# Patient Record
Sex: Female | Born: 1945
Health system: Southern US, Community
[De-identification: ages and names within clinical notes are randomized; demographics above are authoritative.]

## PROBLEM LIST (undated history)

## (undated) DIAGNOSIS — N390 Urinary tract infection, site not specified: Secondary | ICD-10-CM

## (undated) DIAGNOSIS — N879 Dysplasia of cervix uteri, unspecified: Secondary | ICD-10-CM

## (undated) HISTORY — PX: CERVIX LESION DESTRUCTION: SHX591

## (undated) HISTORY — DX: Dysplasia of cervix uteri, unspecified: N87.9

## (undated) HISTORY — DX: Urinary tract infection, site not specified: N39.0

---

## 2016-01-14 ENCOUNTER — Encounter: Payer: Self-pay | Admitting: Family Medicine

## 2016-01-14 ENCOUNTER — Ambulatory Visit (INDEPENDENT_AMBULATORY_CARE_PROVIDER_SITE_OTHER): Payer: Medicare Other | Admitting: Family Medicine

## 2016-01-14 VITALS — BP 116/82 | HR 94 | Temp 97.6°F | Ht 64.5 in | Wt 141.0 lb

## 2016-01-14 DIAGNOSIS — F909 Attention-deficit hyperactivity disorder, unspecified type: Secondary | ICD-10-CM

## 2016-01-14 DIAGNOSIS — R635 Abnormal weight gain: Secondary | ICD-10-CM

## 2016-01-14 DIAGNOSIS — S46812A Strain of other muscles, fascia and tendons at shoulder and upper arm level, left arm, initial encounter: Secondary | ICD-10-CM | POA: Diagnosis not present

## 2016-01-14 DIAGNOSIS — S46819A Strain of other muscles, fascia and tendons at shoulder and upper arm level, unspecified arm, initial encounter: Secondary | ICD-10-CM | POA: Insufficient documentation

## 2016-01-14 DIAGNOSIS — F988 Other specified behavioral and emotional disorders with onset usually occurring in childhood and adolescence: Secondary | ICD-10-CM | POA: Insufficient documentation

## 2016-01-14 NOTE — Assessment & Plan Note (Signed)
Reports history of this. Patient will bring in evaluation from psychologist for my review. Once I reviewed this we will discuss medication to treat this issue.

## 2016-01-14 NOTE — Assessment & Plan Note (Signed)
Location of discomfort and exam most consistent with trapezius strain. Neurologically intact. Discussed heat and ice. Tylenol and Motrin. Advised to limit Motrin. Offered muscle relaxer, though she declined. Given stretching exercises for her neck. Given return precautions.

## 2016-01-14 NOTE — Assessment & Plan Note (Signed)
Patient still has normal BMI. Discussed diet and exercise. She will try to make changes to her diet as able.

## 2016-01-14 NOTE — Progress Notes (Signed)
Patient ID: Shelby Boyd, female   DOB: 25-Jul-1946, 70 y.o.   MRN: [id number]  Shelby Alar, MD Phone: [phone]  [name] is a 70 y.o. female who presents today for new patient visit.  Left neck discomfort: Patient notes for the last 3 weeks the left side of her neck has felt mildly stiff and had a mild tingling to it. She notes this is started after they started renovating her house. She has been doing a significant amount of manual labor and holding her neck and on positions over this time period. No specific injury. Notes hurts to move her neck sometimes. No radiation of symptoms down her arms or legs. No numbness, weakness, or tingling. No fevers, loss of bowel or bladder function, or saddle anesthesia. Notes having better posture helps with this. She's been taking Tylenol and Motrin intermittently for this.  Weight gain: Patient notes for the last year and a half weight has been slowly creeping up. She typically weighs 131-132 pounds. States she is battling the same 8 pounds. She notes weight gain occurred around the time she stopped Prempro and her ADD medication. She also used to play a lot of tennis when they were in [place]. She notes her diet is not as good now as it used to be as they're living in an apartment in renovating a house. Eating more prepared foods.  ADD: Patient notes she underwent testing in her 62s who is in law school. She has a psychological testing paperwork at home. She is on Dexedrine in the past. Has noted a significant difference since being off of this over the last 2 years. While on the medicine she is able to focus significantly better.  Active Ambulatory Problems    Diagnosis Date Noted  . Trapezius strain 01/14/2016  . Weight gain 01/14/2016  . ADD (attention deficit disorder) 01/14/2016   Resolved Ambulatory Problems    Diagnosis Date Noted  . No Resolved Ambulatory Problems   Past Medical History  Diagnosis Date  . UTI (lower urinary  tract infection)   . Precancerous changes of the cervix     Family History  Problem Relation Age of Onset  . Arthritis    . Hyperlipidemia    . Heart disease    . Hypertension    . Stroke    . Diabetes    . Bone cancer Mother     Jaw  . Tuberculosis      Grandmother in the 28s    Social History   Social History  . Marital Status: Married    Spouse Name: N/A  . Number of Children: N/A  . Years of Education: N/A   Occupational History  . Not on file.   Social History Main Topics  . Smoking status: Former Games developer  . Smokeless tobacco: Not on file  . Alcohol Use: 0.0 oz/week    0 Standard drinks or equivalent per week     Comment: glass a wine once a month  . Drug Use: No  . Sexual Activity: Not on file   Other Topics Concern  . Not on file   Social History Narrative  . No narrative on file    ROS   General:  Negative for nexplained weight loss, fever Skin: Negative for new or changing mole, sore that won't heal HEENT: Negative for trouble hearing, trouble seeing, ringing in ears, mouth sores, hoarseness, change in voice, dysphagia. CV:  Negative for chest pain, dyspnea, edema, palpitations Resp: Negative for cough, dyspnea,  hemoptysis GI: Negative for nausea, vomiting, diarrhea, constipation, abdominal pain, melena, hematochezia. GU: Negative for dysuria, incontinence, urinary hesitance, hematuria, vaginal or penile discharge, polyuria, sexual difficulty, lumps in testicle or breasts MSK: Positive for neck pain, Negative for muscle cramps or aches, joint pain or swelling Neuro: Negative for headaches, weakness, numbness, dizziness, passing out/fainting Psych: Negative for depression, anxiety, memory problems  Objective  Physical Exam Filed Vitals:   01/14/16 0926  BP: 116/82  Pulse: 94  Temp: 97.6 F (36.4 C)    BP Readings from Last 3 Encounters:  01/14/16 116/82   Wt Readings from Last 3 Encounters:  01/14/16 141 lb (63.957 kg)    Physical  Exam  Constitutional: She is well-developed, well-nourished, and in no distress.  HENT:  Head: Normocephalic and atraumatic.  Right Ear: External ear normal.  Left Ear: External ear normal.  Mouth/Throat: Oropharynx is clear and moist. No oropharyngeal exudate.  Eyes: Conjunctivae are normal. Pupils are equal, round, and reactive to light.  Neck: Neck supple.  Cardiovascular: Normal rate, regular rhythm and normal heart sounds.  Exam reveals no gallop and no friction rub.   No murmur heard. Pulmonary/Chest: Effort normal. No respiratory distress. She has no wheezes. She has no rales.  Abdominal: Soft. Bowel sounds are normal. She exhibits no distension. There is no tenderness. There is no rebound and no guarding.  Musculoskeletal:  No midline spine tenderness, mild muscular left neck tenderness with no spasm or swelling, full range of motion of neck rotation  Lymphadenopathy:    She has no cervical adenopathy.  Neurological: She is alert.  5/5 strength in bilateral biceps, triceps, grip, quads, hamstrings, plantar and dorsiflexion, sensation to light touch intact in bilateral UE and LE, normal gait, 2+ patellar reflexes  Skin: Skin is warm and dry. She is not diaphoretic.  Psychiatric: Mood and affect normal.     Assessment/Plan:   Trapezius strain Location of discomfort and exam most consistent with trapezius strain. Neurologically intact. Discussed heat and ice. Tylenol and Motrin. Advised to limit Motrin. Offered muscle relaxer, though she declined. Given stretching exercises for her neck. Given return precautions.  Weight gain Patient still has normal BMI. Discussed diet and exercise. She will try to make changes to her diet as able.  ADD (attention deficit disorder) Reports history of this. Patient will bring in evaluation from psychologist for my review. Once I reviewed this we will discuss medication to treat this issue.    Shelby Boyd

## 2016-01-14 NOTE — Progress Notes (Signed)
Pre visit review using our clinic review tool, if applicable. No additional management support is needed unless otherwise documented below in the visit note. 

## 2016-01-14 NOTE — Patient Instructions (Signed)
Nice to meet you. You likely strained a muscle in her neck. Please do the following exercises. She can also use Motrin and Tylenol as needed. Try to limit the number of Motrin you take per day. Please continue to monitor your diet by keeping a diet journal. We will continue to work on your weight. If you develop numbness, weakness, tingling, or any new or changing symptoms please seek medical attention.

## 2016-01-21 ENCOUNTER — Telehealth: Payer: Self-pay | Admitting: Family Medicine

## 2016-01-21 NOTE — Telephone Encounter (Signed)
Pt dropped of medical records that Dr. Birdie Sons requested. Records are in Dr. Purvis Sheffield box in a brown envelope.

## 2016-01-22 NOTE — Telephone Encounter (Signed)
Put on Dr. Birdie Sons desk

## 2016-02-11 ENCOUNTER — Ambulatory Visit (INDEPENDENT_AMBULATORY_CARE_PROVIDER_SITE_OTHER): Payer: Medicare Other | Admitting: Family Medicine

## 2016-02-11 ENCOUNTER — Encounter: Payer: Self-pay | Admitting: Family Medicine

## 2016-02-11 VITALS — BP 128/84 | HR 87 | Temp 97.6°F | Ht 64.5 in | Wt 143.0 lb

## 2016-02-11 DIAGNOSIS — S46812D Strain of other muscles, fascia and tendons at shoulder and upper arm level, left arm, subsequent encounter: Secondary | ICD-10-CM

## 2016-02-11 DIAGNOSIS — R635 Abnormal weight gain: Secondary | ICD-10-CM | POA: Diagnosis not present

## 2016-02-11 DIAGNOSIS — F909 Attention-deficit hyperactivity disorder, unspecified type: Secondary | ICD-10-CM | POA: Diagnosis not present

## 2016-02-11 DIAGNOSIS — F988 Other specified behavioral and emotional disorders with onset usually occurring in childhood and adolescence: Secondary | ICD-10-CM

## 2016-02-11 DIAGNOSIS — S46812A Strain of other muscles, fascia and tendons at shoulder and upper arm level, left arm, initial encounter: Secondary | ICD-10-CM

## 2016-02-11 MED ORDER — AMPHETAMINE-DEXTROAMPHET ER 10 MG PO CP24: 10.0000 mg | ORAL_CAPSULE | Freq: Every day | ORAL | Status: DC

## 2016-02-11 NOTE — Assessment & Plan Note (Signed)
Resolved. Will continue stretches as needed. Can continue heat as well. She will continue to monitor. Given return precautions.

## 2016-02-11 NOTE — Patient Instructions (Signed)
Nice to see you. We will start you on Adderall XR 10 mg daily. Please monitor for sleep issues and appetite issues if he has trouble sleeping or have decreased appetite please let us know. Please start exercising as we discussed. Please continue to monitor your neck pain.

## 2016-02-11 NOTE — Assessment & Plan Note (Signed)
Up 2 more pounds. Still with a normal BMI. Discussed diet and exercise again. She will start exercising more once weather improves. We'll continue to monitor.

## 2016-02-11 NOTE — Assessment & Plan Note (Signed)
Remains an issue. Paperwork was reviewed. Appears that she could benefit from stimulant medication to help with this issue. We will start her on Adderall XR 10 mg daily. She was advised to monitor for insomnia and decreased appetite. We'll see her back in a month to see how this is helped.

## 2016-02-11 NOTE — Progress Notes (Signed)
Patient ID: Shelby Boyd, female   DOB: 04/01/1946, 70 y.o.   MRN: [id number]  Shelby AlarEric Castulo Scarpelli, MD Phone: [phone]  Shelby Boyd is a 70 y.o. female who presents today for follow-up.  ADD: Patient previously brought in paperwork outlining psychological evaluation for ADD. This was reviewed. She notes attention is still an issue. Notes planning is not as good when she is not on medication. She notes typically a low dose of Dexedrine in the past as been beneficial.  Weight gain: Patient is up 2 pounds from her last visit. She has not really increased her physical activity. She notes they have been eating somewhat better by eating at home more often. They have joined the tennis club and joined the [employer] and will be exercising more once weather improves.  Trapezius strain: Is much improved. No pain at this time. Notes heat and stretches helped significantly. Range of motion is significantly improved.  PMH: Former smoker.   ROS see history of present illness  Objective  Physical Exam Filed Vitals:   02/11/16 0926  BP: 128/84  Pulse: 87  Temp: 97.6 F (36.4 C)    BP Readings from Last 3 Encounters:  02/11/16 128/84  01/14/16 116/82   Wt Readings from Last 3 Encounters:  02/11/16 143 lb (64.864 kg)  01/14/16 141 lb (63.957 kg)    Physical Exam  Constitutional: She is well-developed, well-nourished, and in no distress.  Cardiovascular: Normal rate, regular rhythm and normal heart sounds.   Pulmonary/Chest: Effort normal and breath sounds normal.  Musculoskeletal:  No midline neck tenderness, no midline neck or step off, no muscular neck tenderness, full range of motion of neck  Neurological: She is alert. Gait normal.  5 out of 5 strength in bilateral biceps, triceps, grip, sensation to light touch intact in bilateral upper extremities  Skin: Skin is warm and dry. She is not diaphoretic.     Assessment/Plan: Please see individual problem list.  Trapezius  strain Resolved. Will continue stretches as needed. Can continue heat as well. She will continue to monitor. Given return precautions.  ADD (attention deficit disorder) Remains an issue. Paperwork was reviewed. Appears that she could benefit from stimulant medication to help with this issue. We will start her on Adderall XR 10 mg daily. She was advised to monitor for insomnia and decreased appetite. We'll see her back in a month to see how this is helped.  Weight gain Up 2 more pounds. Still with a normal BMI. Discussed diet and exercise again. She will start exercising more once weather improves. We'll continue to monitor.    No orders of the defined types were placed in this encounter.    Meds ordered this encounter  Medications  . amphetamine-dextroamphetamine (ADDERALL XR) 10 MG 24 hr capsule    Sig: Take 1 capsule (10 mg total) by mouth daily.    Dispense:  30 capsule    Refill:  0   Shelby AlarEric Adler Alton, MD [facility] [place]

## 2016-03-10 ENCOUNTER — Encounter: Payer: Self-pay | Admitting: Family Medicine

## 2016-03-10 ENCOUNTER — Ambulatory Visit (INDEPENDENT_AMBULATORY_CARE_PROVIDER_SITE_OTHER): Payer: Medicare Other | Admitting: Family Medicine

## 2016-03-10 VITALS — BP 118/76 | HR 92 | Temp 97.8°F | Ht 64.5 in | Wt 137.4 lb

## 2016-03-10 DIAGNOSIS — R635 Abnormal weight gain: Secondary | ICD-10-CM

## 2016-03-10 DIAGNOSIS — F909 Attention-deficit hyperactivity disorder, unspecified type: Secondary | ICD-10-CM

## 2016-03-10 DIAGNOSIS — F988 Other specified behavioral and emotional disorders with onset usually occurring in childhood and adolescence: Secondary | ICD-10-CM

## 2016-03-10 DIAGNOSIS — Z01419 Encounter for gynecological examination (general) (routine) without abnormal findings: Secondary | ICD-10-CM | POA: Diagnosis not present

## 2016-03-10 MED ORDER — AMPHETAMINE-DEXTROAMPHET ER 10 MG PO CP24: 10.0000 mg | ORAL_CAPSULE | Freq: Every day | ORAL | Status: DC

## 2016-03-10 NOTE — Patient Instructions (Signed)
Nice to see you. Congratulations on weight loss. Continue your dietary changes and exercise. I refilled your Adderall. There is been a referral placed to the gynecologist. If you do not hear anything in the next week please let us know.

## 2016-03-10 NOTE — Assessment & Plan Note (Signed)
Well-controlled at this time. Tolerating Adderall dose. We'll continue this. Refill given for a month and then 90 days to send her mail-order pharmacy.

## 2016-03-10 NOTE — Progress Notes (Signed)
Patient ID: Shelby Boyd, female   DOB: 01/18/1946, 70 y.o.   MRN: [id number]  Marikay AlarEric Keeanna Villafranca, MD Phone: [phone]  Shelby Boyd is a 70 y.o. female who presents today for follow-up.  ADD: Patient notes the Adderall is been significantly helpful. She is happier than previously. She is planning better. She does note she had bizarre dreams the first 2 nights being on it. No palpitations. Blood pressure is been okay. Appetite is good. No tremor.  Weight gain: Patient is down 6 pounds since her last visit. Not craving carbs as much since being on the Adderall. She is eating more proteins and vegetables. Eating slower to decrease her portion sizes. She is more active now that it is warmer. Playing tennis more now.  PMH: Former smoker   ROS see history of present illness  Objective  Physical Exam Filed Vitals:   03/10/16 0941  BP: 118/76  Pulse: 92  Temp: 97.8 F (36.6 C)    BP Readings from Last 3 Encounters:  03/10/16 118/76  02/11/16 128/84  01/14/16 116/82   Wt Readings from Last 3 Encounters:  03/10/16 137 lb 6.4 oz (62.324 kg)  02/11/16 143 lb (64.864 kg)  01/14/16 141 lb (63.957 kg)    Physical Exam  Constitutional: She is well-developed, well-nourished, and in no distress.  HENT:  Head: Normocephalic and atraumatic.  Cardiovascular: Normal rate, regular rhythm and normal heart sounds.   Pulmonary/Chest: Effort normal and breath sounds normal.  Musculoskeletal: She exhibits no edema.  Neurological: She is alert. Gait normal.  Skin: Skin is warm and dry. She is not diaphoretic.     Assessment/Plan: Please see individual problem list.  ADD (attention deficit disorder) Well-controlled at this time. Tolerating Adderall dose. We'll continue this. Refill given for a month and then 90 days to send her mail-order pharmacy.  Weight gain Weight is improving. She's been more physically active. Eating lesser sized portions. She will continue to work on  this.    Orders Placed This Encounter  Procedures  . Ambulatory referral to Gynecology    Referral Priority:  Routine    Referral Type:  Consultation    Referral Reason:  Specialty Services Required    Requested Specialty:  Gynecology    Number of Visits Requested:  1  Referral to gynecology placed for yearly exams.was previously supposed be placed. I apologized for this not being placed earlier.Patient opted to hold off on discussion of DEXA scan and mammography until she followed up with her new gynecologist.  Meds ordered this encounter  Medications  . amphetamine-dextroamphetamine (ADDERALL XR) 10 MG 24 hr capsule    Sig: Take 1 capsule (10 mg total) by mouth daily.    Dispense:  30 capsule    Refill:  0  . amphetamine-dextroamphetamine (ADDERALL XR) 10 MG 24 hr capsule    Sig: Take 1 capsule (10 mg total) by mouth daily.    Dispense:  90 capsule    Refill:  0    Marikay AlarEric Jakai Onofre, MD [facility] [place]

## 2016-03-10 NOTE — Progress Notes (Signed)
Pre visit review using our clinic review tool, if applicable. No additional management support is needed unless otherwise documented below in the visit note. 

## 2016-03-10 NOTE — Assessment & Plan Note (Signed)
Weight is improving. She's been more physically active. Eating lesser sized portions. She will continue to work on this.

## 2016-03-19 DIAGNOSIS — Z01419 Encounter for gynecological examination (general) (routine) without abnormal findings: Secondary | ICD-10-CM | POA: Diagnosis not present

## 2016-03-19 DIAGNOSIS — Z1151 Encounter for screening for human papillomavirus (HPV): Secondary | ICD-10-CM | POA: Diagnosis not present

## 2016-03-19 DIAGNOSIS — Z1231 Encounter for screening mammogram for malignant neoplasm of breast: Secondary | ICD-10-CM | POA: Diagnosis not present

## 2016-03-19 LAB — HM MAMMOGRAPHY

## 2016-04-20 ENCOUNTER — Telehealth: Payer: Self-pay

## 2016-04-20 ENCOUNTER — Encounter: Payer: Self-pay | Admitting: Emergency Medicine

## 2016-04-20 ENCOUNTER — Emergency Department: Payer: Medicare Other

## 2016-04-20 ENCOUNTER — Emergency Department
Admission: EM | Admit: 2016-04-20 | Discharge: 2016-04-20 | Disposition: A | Discharge status: Home / Self Care | Payer: Medicare Other | Attending: Emergency Medicine | Admitting: Emergency Medicine

## 2016-04-20 DIAGNOSIS — Y9389 Activity, other specified: Secondary | ICD-10-CM | POA: Insufficient documentation

## 2016-04-20 DIAGNOSIS — Z79899 Other long term (current) drug therapy: Secondary | ICD-10-CM | POA: Diagnosis not present

## 2016-04-20 DIAGNOSIS — Y999 Unspecified external cause status: Secondary | ICD-10-CM | POA: Insufficient documentation

## 2016-04-20 DIAGNOSIS — M799 Soft tissue disorder, unspecified: Secondary | ICD-10-CM | POA: Diagnosis not present

## 2016-04-20 DIAGNOSIS — Z87891 Personal history of nicotine dependence: Secondary | ICD-10-CM | POA: Diagnosis not present

## 2016-04-20 DIAGNOSIS — S50851A Superficial foreign body of right forearm, initial encounter: Secondary | ICD-10-CM | POA: Diagnosis not present

## 2016-04-20 DIAGNOSIS — F909 Attention-deficit hyperactivity disorder, unspecified type: Secondary | ICD-10-CM | POA: Insufficient documentation

## 2016-04-20 DIAGNOSIS — S5011XA Contusion of right forearm, initial encounter: Secondary | ICD-10-CM

## 2016-04-20 DIAGNOSIS — Y92009 Unspecified place in unspecified non-institutional (private) residence as the place of occurrence of the external cause: Secondary | ICD-10-CM | POA: Insufficient documentation

## 2016-04-20 DIAGNOSIS — W458XXA Other foreign body or object entering through skin, initial encounter: Secondary | ICD-10-CM | POA: Diagnosis not present

## 2016-04-20 MED ORDER — LIDOCAINE-EPINEPHRINE (PF) 1 %-1:200000 IJ SOLN
10.0000 mL | Freq: Once | INTRAMUSCULAR | Status: DC
  Filled 2016-04-20: qty 30

## 2016-04-20 MED ORDER — CEPHALEXIN 500 MG PO CAPS: 500.0000 mg | ORAL_CAPSULE | Freq: Three times a day (TID) | ORAL | Status: DC

## 2016-04-20 NOTE — ED Notes (Signed)
Pt with splinter to right forearm.

## 2016-04-20 NOTE — ED Notes (Signed)
Pulling pine boards, large splinter R forearm, approx 1 hour ago.

## 2016-04-20 NOTE — Telephone Encounter (Signed)
Patient came into the office with a laceration/splinter on her right forearm.  She requested to be seen to have it removed.  Unfortunately all provider were busy with other patients, and we did not have the proper equipment.  Advised patient to go to urgent care.  She was stable and sent on her way.

## 2016-04-20 NOTE — Discharge Instructions (Signed)
Contusion A contusion is a deep bruise. Contusions are the result of a blunt injury to tissues and muscle fibers under the skin. The injury causes bleeding under the skin. The skin overlying the contusion may turn blue, purple, or yellow. Minor injuries will give you a painless contusion, but more severe contusions may stay painful and swollen for a few weeks.  CAUSES  This condition is usually caused by a blow, trauma, or direct force to an area of the body. SYMPTOMS  Symptoms of this condition include:  Swelling of the injured area.  Pain and tenderness in the injured area.  Discoloration. The area may have redness and then turn blue, purple, or yellow. DIAGNOSIS  This condition is diagnosed based on a physical exam and medical history. An X-ray, CT scan, or MRI may be needed to determine if there are any associated injuries, such as broken bones (fractures). TREATMENT  Specific treatment for this condition depends on what area of the body was injured. In general, the best treatment for a contusion is resting, icing, applying pressure to (compression), and elevating the injured area. This is often called the RICE strategy. Over-the-counter anti-inflammatory medicines may also be recommended for pain control.  HOME CARE INSTRUCTIONS   Rest the injured area.  If directed, apply ice to the injured area:  Put ice in a plastic bag.  Place a towel between your skin and the bag.  Leave the ice on for 20 minutes, 2-3 times per day.  If directed, apply light compression to the injured area using an elastic bandage. Make sure the bandage is not wrapped too tightly. Remove and reapply the bandage as directed by your health care provider.  If possible, raise (elevate) the injured area above the level of your heart while you are sitting or lying down.  Take over-the-counter and prescription medicines only as told by your health care provider. SEEK MEDICAL CARE IF:  Your symptoms do not  improve after several days of treatment.  Your symptoms get worse.  You have difficulty moving the injured area. SEEK IMMEDIATE MEDICAL CARE IF:   You have severe pain.  You have numbness in a hand or foot.  Your hand or foot turns pale or cold.   This information is not intended to replace advice given to you by your health care provider. Make sure you discuss any questions you have with your health care provider.   Document Released: 08/26/2005 Document Revised: 08/07/2015 Document Reviewed: 04/03/2015 Elsevier Interactive Patient Education 2016 [employer]  Cryotherapy Cryotherapy is when you put ice on your injury. Ice helps lessen pain and puffiness (swelling) after an injury. Ice works the best when you start using it in the first 24 to 48 hours after an injury. HOME CARE  Put a dry or damp towel between the ice pack and your skin.  You may press gently on the ice pack.  Leave the ice on for no more than 10 to 20 minutes at a time.  Check your skin after 5 minutes to make sure your skin is okay.  Rest at least 20 minutes between ice pack uses.  Stop using ice when your skin loses feeling (numbness).  Do not use ice on someone who cannot tell you when it hurts. This includes small children and people with memory problems (dementia). GET HELP RIGHT AWAY IF:  You have white spots on your skin.  Your skin turns blue or pale.  Your skin feels waxy or hard.  Your  puffiness gets worse. MAKE SURE YOU:   Understand these instructions.  Will watch your condition.  Will get help right away if you are not doing well or get worse.   This information is not intended to replace advice given to you by your health care provider. Make sure you discuss any questions you have with your health care provider.   Document Released: 05/04/2008 Document Revised: 02/08/2012 Document Reviewed: 07/09/2011 Elsevier Interactive Patient Education 2016 Elsevier Inc.  Sliver Removal,  Care After A sliver--also called a splinter--is a small and thin broken piece of an object that gets stuck (embedded) under the skin. A sliver can create a deep wound that can easily become infected. It is important to care for the wound after a sliver is removed to help prevent infection and other problems from developing. WHAT TO EXPECT AFTER THE PROCEDURE Slivers often break into smaller pieces when they are removed. If pieces of your sliver broke off and stayed in your skin, you will eventually see them working themselves out and you may feel some pain at the wound site. This is normal. HOME CARE INSTRUCTIONS  Keep all follow-up visits as directed by your health care provider. This is important.  There are many different ways to close and cover a wound, including stitches (sutures) and adhesive strips. Follow your health care provider's instructions about:  Wound care.  Bandage (dressing) changes and removal.  Wound closure removal.  Check the wound site every day for signs of infection. Watch for:  Red streaks coming from the wound.  Fever.  Redness or tenderness around the wound.  Fluid, blood, or pus coming from the wound.  A bad smell coming from the wound. SEEK MEDICAL CARE IF:  You think that a piece of the sliver is still in your skin.  Your wound was closed, as with sutures, and the edges of the wound break open.  You have signs of infection, including:  New or worsening redness around the wound.  New or worsening tenderness around the wound.  Fluid, blood, or pus coming from the wound.  A bad smell coming from the wound or dressing. SEEK IMMEDIATE MEDICAL CARE IF: You have any of the following signs of infection:  Red streaks coming from the wound.  An unexplained fever.   This information is not intended to replace advice given to you by your health care provider. Make sure you discuss any questions you have with your health care provider.   Document  Released: 11/13/2000 Document Revised: 12/07/2014 Document Reviewed: 07/19/2014 Elsevier Interactive Patient Education [employer]

## 2016-04-20 NOTE — ED Provider Notes (Signed)
[facility] Emergency Department Provider Note  ____________________________________________  Time seen: Approximately 4:19 PM  I have reviewed the triage vital signs and the nursing notes.   HISTORY  Chief Complaint Foreign Body in Skin    HPI Shelby OctaveMichele Boyd is a 70 y.o. female , NAD, presents to the emergency department with two-hour history of Wood splinter in the right forearm. States she and her husband are renovating an older home and she was removing yellow pine boards. States the board fell and hit her on her right forearm and subsequently leaving behind a sliver. Patient initially presented to her primary care provider's office but they sent her to this emergency department for evaluation and treatment. Denies any numbness, weakness, tingling about the right upper extremity. Notes that bleeding has been controlled. Does note some bruising about the right forearm around the sliver. Notes that her tetanus is up-to-date as she received a booster 3 years ago.    Past Medical History  Diagnosis Date  . UTI (lower urinary tract infection)   . Precancerous changes of the cervix     Patient Active Problem List   Diagnosis Date Noted  . Trapezius strain 01/14/2016  . Weight gain 01/14/2016  . ADD (attention deficit disorder) 01/14/2016    Past Surgical History  Procedure Laterality Date  . Cesarean section    . Cervix lesion destruction      Current Outpatient Rx  Name  Route  Sig  Dispense  Refill  . amphetamine-dextroamphetamine (ADDERALL XR) 10 MG 24 hr capsule   Oral   Take 1 capsule (10 mg total) by mouth daily.   30 capsule   0   . amphetamine-dextroamphetamine (ADDERALL XR) 10 MG 24 hr capsule   Oral   Take 1 capsule (10 mg total) by mouth daily.   90 capsule   0   . cephALEXin (KEFLEX) 500 MG capsule   Oral   Take 1 capsule (500 mg total) by mouth 3 (three) times daily.   21 capsule   0   . conjugated estrogens (PREMARIN)  vaginal cream   Vaginal   Place 1 Applicatorful vaginally daily.         . Multiple Vitamins-Minerals (MULTI-VITAMIN GUMMIES PO)   Oral   Take by mouth.           Allergies Other and Sodium lauryl sulfate  Family History  Problem Relation Age of Onset  . Arthritis    . Hyperlipidemia    . Heart disease    . Hypertension    . Stroke    . Diabetes    . Bone cancer Mother     Jaw  . Tuberculosis      Grandmother in the [id number]    Social History Social History  Substance Use Topics  . Smoking status: Former Games developermoker  . Smokeless tobacco: None  . Alcohol Use: 0.0 oz/week    0 Standard drinks or equivalent per week     Comment: glass a wine once a month     Review of Systems  Constitutional: No fatigue Eyes: No visual changes.  Cardiovascular: No chest pain. Respiratory: No shortness of breath.  Gastrointestinal: No abdominal pain.  No nausea, vomiting. Musculoskeletal: Negative for back pain.  Skin: Foreign body right forearm. Bruising right forearm. Negative for rash. Neurological: Negative for headaches, focal weakness or numbness. No tingling. 10-point ROS otherwise negative.  ____________________________________________   PHYSICAL EXAM:  VITAL SIGNS: ED Triage Vitals  Enc Vitals Group  BP 04/20/16 1450 147/74 mmHg     Pulse Rate 04/20/16 1450 93     Resp 04/20/16 1450 20     Temp 04/20/16 1450 97.8 F (36.6 C)     Temp Source 04/20/16 1450 Oral     SpO2 04/20/16 1450 98 %     Weight 04/20/16 1450 136 lb (61.689 kg)     Height 04/20/16 1450 5\' 5"  (1.651 m)     Head Cir --      Peak Flow --      Pain Score 04/20/16 1451 3     Pain Loc --      Pain Edu? --      Excl. in GC? --      Constitutional: Alert and oriented. Well appearing and in no acute distress. Eyes: Conjunctivae are normal.  Head: Atraumatic. Cardiovascular: Normal rate, regular rhythm. Normal S1 and S2.  Good peripheral circulation with 2+ pulses noted in the right upper  extremity. Capillary refill is brisk in all digits of the right hand. Respiratory: Normal respiratory effort without tachypnea or retractions.  Musculoskeletal: Full range of motion of the right shoulder, elbow, forearm, wrist, hand, fingers without pain. No pain with pronation and supination of the right forearm.  No joint effusions. Neurologic:  Normal speech and language. Sensation to light touch grossly intact about the right upper extremity Skin:  Foreign body seen and palpated about the dorsal portion of the right forearm with mild blue ecchymosis surrounding the area. Bleeding is controlled. Skin is warm, dry. No rash noted. Psychiatric: Mood and affect are normal. Speech and behavior are normal. Patient exhibits appropriate insight and judgement.   ____________________________________________   LABS  None ____________________________________________  EKG  None ____________________________________________  RADIOLOGY I have personally viewed and evaluated these images (plain radiographs) as part of my medical decision making, as well as reviewing the written report by the radiologist.  Dg Forearm Right  04/20/2016  CLINICAL DATA:  Wood splinter in right posterior distal form. EXAM: RIGHT FOREARM - 2 VIEW COMPARISON:  None. FINDINGS: No radiodense foreign body identified within the soft tissues of the right forearm. There is soft tissue irregularity posterior to the distal radius/ulna but no discrete foreign body identified. Underlying osseous structures of the right forearm appear normal. IMPRESSION: Focal soft tissue irregularity, presumably laceration and/or edema, posterior to the distal right forearm. No radiodense foreign body appreciated. Osseous structures appear normal. Electronically Signed   By: Bary Richard M.D.   On: 04/20/2016 15:39    ____________________________________________    PROCEDURES  Procedure(s) performed: FOREIGN BODY REMOVAL Performed by: Hope Pigeon Authorized by: Hope Pigeon Consent: Verbal consent obtained. Risks and benefits: risks, benefits and alternatives were discussed Consent given by: patient Patient identity confirmed: provided demographic data Prepped and Draped in normal sterile fashion Wound explored  Foreign Body Location: Right forearm  Foreign Body Description: Wood sliver approximately 3.5 cm in length and 0.5 cm in width at the widest part.  Anesthesia: local infiltration  Local anesthetic: lidocaine 1% with epinephrine  Anesthetic total: 3 ml  Technique: Foreign body removed completely with use of pickups. Forearm palpated and no foreign bodies to palpation or noted after the removal. Entrance of the foreign body was irrigated with saline.  Irrigation method: syringe Amount of cleaning: standard  Patient tolerance: Patient tolerated the procedure well with no immediate complications.      Medications  lidocaine-EPINEPHrine (XYLOCAINE-EPINEPHrine) 1 %-1:200000 (PF) injection 10 mL (not administered)  ____________________________________________   INITIAL IMPRESSION / ASSESSMENT AND PLAN / ED COURSE  Pertinent imaging results that were available during my care of the patient were reviewed by me and considered in my medical decision making (see chart for details).  Patient's diagnosis is consistent with foreign body to forearm that was subsequently removedAnd contusion to the right forearm. Patient will be discharged home with prescriptions for Keflex to take as directed to prevent infection. Patient is to follow up with her primary care provider in 48 hours for wound recheck if needed. Patient is given ED precautions to return to the ED for any worsening or new symptoms.    ____________________________________________  FINAL CLINICAL IMPRESSION(S) / ED DIAGNOSES  Final diagnoses:  Foreign body in right forearm, initial encounter  Contusion of right forearm, initial encounter       NEW MEDICATIONS STARTED DURING THIS VISIT:  New Prescriptions   CEPHALEXIN (KEFLEX) 500 MG CAPSULE    Take 1 capsule (500 mg total) by mouth 3 (three) times daily.       Hope Pigeon, PA-C 04/20/16 1628  Phineas Semen, MD 04/20/16 1806

## 2016-04-21 ENCOUNTER — Encounter: Payer: Self-pay | Admitting: Surgical

## 2016-06-09 ENCOUNTER — Encounter: Payer: Self-pay | Admitting: Family Medicine

## 2016-06-09 ENCOUNTER — Ambulatory Visit (INDEPENDENT_AMBULATORY_CARE_PROVIDER_SITE_OTHER): Payer: Medicare Other | Admitting: Family Medicine

## 2016-06-09 VITALS — BP 124/86 | HR 98 | Temp 98.2°F | Ht 64.5 in | Wt 135.4 lb

## 2016-06-09 DIAGNOSIS — Z23 Encounter for immunization: Secondary | ICD-10-CM

## 2016-06-09 DIAGNOSIS — IMO0002 Reserved for concepts with insufficient information to code with codable children: Secondary | ICD-10-CM

## 2016-06-09 DIAGNOSIS — T148 Other injury of unspecified body region: Secondary | ICD-10-CM

## 2016-06-09 DIAGNOSIS — L905 Scar conditions and fibrosis of skin: Secondary | ICD-10-CM | POA: Insufficient documentation

## 2016-06-09 DIAGNOSIS — F909 Attention-deficit hyperactivity disorder, unspecified type: Secondary | ICD-10-CM | POA: Diagnosis not present

## 2016-06-09 DIAGNOSIS — F988 Other specified behavioral and emotional disorders with onset usually occurring in childhood and adolescence: Secondary | ICD-10-CM

## 2016-06-09 MED ORDER — AMPHETAMINE-DEXTROAMPHETAMINE 10 MG PO TABS: 5.0000 mg | ORAL_TABLET | Freq: Every day | ORAL | Status: DC

## 2016-06-09 NOTE — Assessment & Plan Note (Signed)
Stable. She wants to switch to immediate release Adderall. We'll start at 5 mg twice daily after she completes her current dosage of Adderall XR. She can do this for a week and if not helping enough can increase to 10 mg twice daily. She'll call in a month for refill and let us know which dose she has decided on.

## 2016-06-09 NOTE — Progress Notes (Signed)
Patient ID: Shelby OctaveMichele Montel, female   DOB: 10/04/1946, 70 y.o.   MRN: [id number]  Marikay AlarEric Sonnenberg, MD Phone: [phone]  Shelby Boyd is a 70 y.o. female who presents today for follow-up.  ADHD: Patient notes doing well on Adderall. Notes it does not seem to last long enough. Notes she no other evidence by 2:30 PM. Notes there is also a cost issue with the extended release version. She like to switch to the immediate release version. No palpitations, weight changes, appetite changes, sleep issues, or tremors  Patient additionally notes she went to the emergency room since her last visit for a cut her right forearm. No she had a quarter inch wide piece of wood that punctured her skin it was sticking out. They removed it. Put stitches in. She notes some residual scar tissue though no erythema, swelling, numbness, or tingling   ROS see history of present illness  Objective  Physical Exam Filed Vitals:   06/09/16 0948  BP: 124/86  Pulse: 98  Temp: 98.2 F (36.8 C)    BP Readings from Last 3 Encounters:  06/09/16 124/86  04/20/16 147/74  03/10/16 118/76   Wt Readings from Last 3 Encounters:  06/09/16 135 lb 6.4 oz (61.417 kg)  04/20/16 136 lb (61.689 kg)  03/10/16 137 lb 6.4 oz (62.324 kg)    Physical Exam  Constitutional: She is well-developed, well-nourished, and in no distress.  Cardiovascular: Normal rate, regular rhythm and normal heart sounds.   Pulmonary/Chest: Effort normal and breath sounds normal.  Musculoskeletal:  Right distal dorsal forearm with 2 cm long scar noted, nontender, no surrounding erythema, well-healed, 5 out of 5 strength in her right hand with grip, sensation light touch intact in right hand and arm, good capillary refill in right hand  Neurological: She is alert. Gait normal.  Skin: Skin is warm and dry.     Assessment/Plan: Please see individual problem list.  ADD (attention deficit disorder) Stable. She wants to switch to immediate  release Adderall. We'll start at 5 mg twice daily after she completes her current dosage of Adderall XR. She can do this for a week and if not helping enough can increase to 10 mg twice daily. She'll call in a month for refill and let us know which dose she has decided on.  Laceration Well-healed dorsal distal right forearm laceration. Neurovascularly intact. She'll continue to monitor the area.    Orders Placed This Encounter  Procedures  . Pneumococcal conjugate vaccine 13-valent    Meds ordered this encounter  Medications  . amphetamine-dextroamphetamine (ADDERALL) 10 MG tablet    Sig: Take 0.5 tablets (5 mg total) by mouth daily with breakfast.    Dispense:  60 tablet    Refill:  0    Marikay AlarEric Sonnenberg, MD [facility] [place]

## 2016-06-09 NOTE — Progress Notes (Signed)
Pre visit review using our clinic review tool, if applicable. No additional management support is needed unless otherwise documented below in the visit note. 

## 2016-06-09 NOTE — Patient Instructions (Signed)
Nice to see you. We are going to change your Adderall to immediate release. You will take 5 mg twice daily. If this is not beneficial can increase to 10 mg twice daily in one week. Please monitor the area where you, your self and if there is any change let us know. Please see if you can contact her former PCP to get records sent to us.

## 2016-06-09 NOTE — Assessment & Plan Note (Signed)
Well-healed dorsal distal right forearm laceration. Neurovascularly intact. She'll continue to monitor the area.

## 2016-06-11 ENCOUNTER — Telehealth: Payer: Self-pay | Admitting: Family Medicine

## 2016-06-11 NOTE — Telephone Encounter (Signed)
Pt called about her medical records that was sent in April. Did we receive pt records? Pt called past doctor and they said it was sent on 03/11/16 and they have confirmation.  Call pt @ [phone]. Thank you!

## 2016-06-11 NOTE — Telephone Encounter (Signed)
Spoke with the patient, she confirmed that her old PCP had sent records, I verified with Provider they have not been received.  Spoke with Jari Favrescar in Medical records at old PCP office, he will resend today. thanks

## 2016-06-11 NOTE — Telephone Encounter (Signed)
Noted thank you

## 2016-06-29 DIAGNOSIS — H52223 Regular astigmatism, bilateral: Secondary | ICD-10-CM | POA: Diagnosis not present

## 2016-06-29 DIAGNOSIS — H524 Presbyopia: Secondary | ICD-10-CM | POA: Diagnosis not present

## 2016-06-29 DIAGNOSIS — H5203 Hypermetropia, bilateral: Secondary | ICD-10-CM | POA: Diagnosis not present

## 2016-07-07 ENCOUNTER — Other Ambulatory Visit: Payer: Self-pay | Admitting: Surgical

## 2016-07-07 MED ORDER — AMPHETAMINE-DEXTROAMPHETAMINE 10 MG PO TABS: 10.0000 mg | ORAL_TABLET | Freq: Two times a day (BID) | ORAL | 0 refills | Status: DC

## 2016-07-07 MED ORDER — AMPHETAMINE-DEXTROAMPHETAMINE 10 MG PO TABS: 5.0000 mg | ORAL_TABLET | Freq: Two times a day (BID) | ORAL | 0 refills | Status: DC

## 2016-09-07 ENCOUNTER — Other Ambulatory Visit: Payer: Self-pay | Admitting: Family Medicine

## 2016-09-07 MED ORDER — AMPHETAMINE-DEXTROAMPHETAMINE 10 MG PO TABS: 10.0000 mg | ORAL_TABLET | Freq: Two times a day (BID) | ORAL | 0 refills | Status: DC

## 2016-09-07 NOTE — Telephone Encounter (Signed)
Patient advised.

## 2016-09-07 NOTE — Telephone Encounter (Signed)
Refill given. Please place at front.

## 2016-09-07 NOTE — Telephone Encounter (Signed)
Pt called about needing a refill for amphetamine-dextroamphetamine (ADDERALL) 10 MG tablet. 60 tablets 1 tab by mouth twice daily with a meal. Pt states medication is working just fine. Pt splits pill 5 mg in the am and 5 mg in the pm.  Call pt when it's ready to pick up @ [phone]. Thank you!

## 2016-09-07 NOTE — Telephone Encounter (Signed)
Can we refill this? 

## 2016-09-10 ENCOUNTER — Ambulatory Visit: Payer: Medicare Other | Admitting: Family Medicine

## 2016-10-28 ENCOUNTER — Encounter: Payer: Self-pay | Admitting: Family Medicine

## 2016-10-28 ENCOUNTER — Ambulatory Visit (INDEPENDENT_AMBULATORY_CARE_PROVIDER_SITE_OTHER): Payer: Medicare Other | Admitting: Family Medicine

## 2016-10-28 VITALS — BP 122/76 | HR 96 | Temp 98.2°F | Wt 134.0 lb

## 2016-10-28 DIAGNOSIS — F988 Other specified behavioral and emotional disorders with onset usually occurring in childhood and adolescence: Secondary | ICD-10-CM

## 2016-10-28 DIAGNOSIS — Z1211 Encounter for screening for malignant neoplasm of colon: Secondary | ICD-10-CM

## 2016-10-28 DIAGNOSIS — L905 Scar conditions and fibrosis of skin: Secondary | ICD-10-CM | POA: Diagnosis not present

## 2016-10-28 MED ORDER — AMPHETAMINE-DEXTROAMPHETAMINE 5 MG PO TABS: 5.0000 mg | ORAL_TABLET | Freq: Every day | ORAL | 0 refills | Status: DC

## 2016-10-28 NOTE — Progress Notes (Signed)
  Shelby AlarEric Muaad Boehning, Shelby Boyd Phone: [phone]  Shelby Boyd is a 70 y.o. female who presents today for follow-up.  ADD: Doing well on Adderall. Taking 5 mg twice daily. No palpitations, weight loss, or appetite changes. Feels this is helpful.  Patient had a laceration previously on her right distal forearm on the dorsal aspect. She had had a piece of wood removed in the ED about 4 months ago. Initially the area was well healing though did persist with some mild erythema and scabbing. She notes earlier this month she had a small amount of wood come out again. Notes since then it has healed up completely. She notes no pain or tenderness in this area. No erythema.   ROS see history of present illness  Objective  Physical Exam Vitals:   10/28/16 1048  BP: 122/76  Pulse: 96  Temp: 98.2 F (36.8 C)    BP Readings from Last 3 Encounters:  10/28/16 122/76  06/09/16 124/86  04/20/16 (!) 147/74   Wt Readings from Last 3 Encounters:  10/28/16 134 lb (60.8 kg)  06/09/16 135 lb 6.4 oz (61.4 kg)  04/20/16 136 lb (61.7 kg)    Physical Exam  Constitutional: She is well-developed, well-nourished, and in no distress.  Cardiovascular: Normal rate, regular rhythm and normal heart sounds.   Pulmonary/Chest: Effort normal and breath sounds normal.  Neurological: She is alert. Gait normal.  Skin: Skin is warm and dry.        Assessment/Plan: Please see individual problem list.  ADD (attention deficit disorder) Well controlled. Tolerating medication. Refills given.   Scar Area noted to be well-healing. She does report earlier this month she had a small amount of wood shard come out of her skin. Has since healed with no issues. She'll continue to monitor this area and if has recurrence of any symptoms she'll let us know.   Orders Placed This Encounter  Procedures  . Ambulatory referral to Gastroenterology    Referral Priority:   Routine    Referral Type:   Consultation    Referral  Reason:   Specialty Services Required    Number of Visits Requested:   1    Meds ordered this encounter  Medications  . amphetamine-dextroamphetamine (ADDERALL) 5 MG tablet    Sig: Take 1 tablet (5 mg total) by mouth daily.    Dispense:  60 tablet    Refill:  0  . amphetamine-dextroamphetamine (ADDERALL) 5 MG tablet    Sig: Take 1 tablet (5 mg total) by mouth daily. Do not fill until 12/28/16    Dispense:  60 tablet    Refill:  0  . amphetamine-dextroamphetamine (ADDERALL) 5 MG tablet    Sig: Take 1 tablet (5 mg total) by mouth daily. Do not fill until 11/19/16    Dispense:  60 tablet    Refill:  0    # Healthcare maintenance: Referral to GI for colonoscopy placed.   Shelby AlarEric Brynn Mulgrew, Shelby Boyd [facility] [place]

## 2016-10-28 NOTE — Assessment & Plan Note (Signed)
Area noted to be well-healing. She does report earlier this month she had a small amount of wood shard come out of her skin. Has since healed with no issues. She'll continue to monitor this area and if has recurrence of any symptoms she'll let us know.

## 2016-10-28 NOTE — Progress Notes (Signed)
Pre visit review using our clinic review tool, if applicable. No additional management support is needed unless otherwise documented below in the visit note. 

## 2016-10-28 NOTE — Assessment & Plan Note (Signed)
Well controlled. Tolerating medication. Refills given.

## 2016-10-28 NOTE — Patient Instructions (Signed)
Nice to see you. I have refilled your Adderall. Please monitor the area of prior laceration. If you have any recurrent symptoms or please let us know. We'll get you referred for a colonoscopy.

## 2016-11-02 ENCOUNTER — Telehealth: Payer: Self-pay | Admitting: Family Medicine

## 2016-11-02 MED ORDER — AMPHETAMINE-DEXTROAMPHETAMINE 5 MG PO TABS: 5.0000 mg | ORAL_TABLET | Freq: Two times a day (BID) | ORAL | 0 refills | Status: DC

## 2016-11-02 NOTE — Telephone Encounter (Signed)
Patient needs new prescription.

## 2016-11-02 NOTE — Telephone Encounter (Signed)
Notified patient and placed up front

## 2016-11-02 NOTE — Telephone Encounter (Signed)
Pt called about the instructions on her Rx was written incorrectly. Pt states it should read Take 1 tablet (5 mg total) by mouth am and pm. Please advise?  Please call pt when it's ready. Thank you!

## 2016-11-02 NOTE — Telephone Encounter (Signed)
Printed. Please place at front for pickup.

## 2016-11-02 NOTE — Telephone Encounter (Signed)
Patient's prescription should be Adderall 5 mg twice daily. I apologize for this incorrect prescription. If needed we can reprint this for the patient. Thanks.

## 2016-11-02 NOTE — Telephone Encounter (Signed)
Please advise 

## 2016-12-01 ENCOUNTER — Encounter: Payer: Self-pay | Admitting: Family Medicine

## 2016-12-01 ENCOUNTER — Ambulatory Visit (INDEPENDENT_AMBULATORY_CARE_PROVIDER_SITE_OTHER): Payer: Medicare Other | Admitting: Family Medicine

## 2016-12-01 VITALS — BP 143/78 | HR 105 | Temp 97.5°F | Resp 14 | Wt 134.0 lb

## 2016-12-01 DIAGNOSIS — J069 Acute upper respiratory infection, unspecified: Secondary | ICD-10-CM | POA: Insufficient documentation

## 2016-12-01 DIAGNOSIS — B9789 Other viral agents as the cause of diseases classified elsewhere: Secondary | ICD-10-CM | POA: Diagnosis not present

## 2016-12-01 LAB — POCT RAPID STREP A (OFFICE): Rapid Strep A Screen: NEGATIVE

## 2016-12-01 NOTE — Patient Instructions (Signed)
Upper Respiratory Infection, Adult Most upper respiratory infections (URIs) are a viral infection of the air passages leading to the lungs. A URI affects the nose, throat, and upper air passages. The most common type of URI is nasopharyngitis and is typically referred to as "the common cold." URIs run their course and usually go away on their own. Most of the time, a URI does not require medical attention, but sometimes a bacterial infection in the upper airways can follow a viral infection. This is called a secondary infection. Sinus and middle ear infections are common types of secondary upper respiratory infections. Bacterial pneumonia can also complicate a URI. A URI can worsen asthma and chronic obstructive pulmonary disease (COPD). Sometimes, these complications can require emergency medical care and may be life threatening. What are the causes? Almost all URIs are caused by viruses. A virus is a type of germ and can spread from one person to another. What increases the risk? You may be at risk for a URI if:  You smoke.  You have chronic heart or lung disease.  You have a weakened defense (immune) system.  You are very young or very old.  You have nasal allergies or asthma.  You work in crowded or poorly ventilated areas.  You work in health care facilities or schools.  What are the signs or symptoms? Symptoms typically develop 2-3 days after you come in contact with a cold virus. Most viral URIs last 7-10 days. However, viral URIs from the influenza virus (flu virus) can last 14-18 days and are typically more severe. Symptoms may include:  Runny or stuffy (congested) nose.  Sneezing.  Cough.  Sore throat.  Headache.  Fatigue.  Fever.  Loss of appetite.  Pain in your forehead, behind your eyes, and over your cheekbones (sinus pain).  Muscle aches.  How is this diagnosed? Your health care provider may diagnose a URI by:  Physical exam.  Tests to check that your  symptoms are not due to another condition such as: ? Strep throat. ? Sinusitis. ? Pneumonia. ? Asthma.  How is this treated? A URI goes away on its own with time. It cannot be cured with medicines, but medicines may be prescribed or recommended to relieve symptoms. Medicines may help:  Reduce your fever.  Reduce your cough.  Relieve nasal congestion.  Follow these instructions at home:  Take medicines only as directed by your health care provider.  Gargle warm saltwater or take cough drops to comfort your throat as directed by your health care provider.  Use a warm mist humidifier or inhale steam from a shower to increase air moisture. This may make it easier to breathe.  Drink enough fluid to keep your urine clear or pale yellow.  Eat soups and other clear broths and maintain good nutrition.  Rest as needed.  Return to work when your temperature has returned to normal or as your health care provider advises. You may need to stay home longer to avoid infecting others. You can also use a face mask and careful hand washing to prevent spread of the virus.  Increase the usage of your inhaler if you have asthma.  Do not use any tobacco products, including cigarettes, chewing tobacco, or electronic cigarettes. If you need help quitting, ask your health care provider. How is this prevented? The best way to protect yourself from getting a cold is to practice good hygiene.  Avoid oral or hand contact with people with cold symptoms.  Wash your   hands often if contact occurs.  There is no clear evidence that vitamin C, vitamin E, echinacea, or exercise reduces the chance of developing a cold. However, it is always recommended to get plenty of rest, exercise, and practice good nutrition. Contact a health care provider if:  You are getting worse rather than better.  Your symptoms are not controlled by medicine.  You have chills.  You have worsening shortness of breath.  You have  brown or red mucus.  You have yellow or brown nasal discharge.  You have pain in your face, especially when you bend forward.  You have a fever.  You have swollen neck glands.  You have pain while swallowing.  You have white areas in the back of your throat. Get help right away if:  You have severe or persistent: ? Headache. ? Ear pain. ? Sinus pain. ? Chest pain.  You have chronic lung disease and any of the following: ? Wheezing. ? Prolonged cough. ? Coughing up blood. ? A change in your usual mucus.  You have a stiff neck.  You have changes in your: ? Vision. ? Hearing. ? Thinking. ? Mood. This information is not intended to replace advice given to you by your health care provider. Make sure you discuss any questions you have with your health care provider. Document Released: 05/12/2001 Document Revised: 07/19/2016 Document Reviewed: 02/21/2014 Elsevier Interactive Patient Education  2017 [employer]  

## 2016-12-01 NOTE — Progress Notes (Signed)
   Subjective:  Patient ID: Shelby Boyd, female    DOB: 01/31/1946  Age: 71 y.o. MRN: [id number]  CC: Cough, ST, Hoarseness  HPI:  71 year old female presents for an acute visit with the above complaints.  Patient states that Shelby Boyd's been sick since this past Wednesday. Shelby Boyd [place]. Shelby Boyd has been exposed to several sick grandchildren. Shelby Boyd states that her symptoms started with nausea and vomiting and sore throat. Nausea and vomiting resolved. Shelby Boyd then developed body aches and hoarseness. Shelby Boyd's had a mild cough which has been slightly productive of discolored sputum. No shortness of breath. No fever. No known exacerbating or relieving factors. Patient is concerned about bacterial infection given her discolored sputum. No other associated symptoms. No other complaints at this time.  Social Hx   Social History   Social History  . Marital status: Married    Spouse name: N/A  . Number of children: N/A  . Years of education: N/A   Social History Main Topics  . Smoking status: Former Games developermoker  . Smokeless tobacco: None  . Alcohol use 0.0 oz/week     Comment: glass a wine once a month  . Drug use: No  . Sexual activity: Not Asked   Other Topics Concern  . None   Social History Narrative  . None   Review of Systems  Constitutional: Negative for fever.  HENT: Positive for congestion and sore throat.   Respiratory: Positive for cough. Negative for shortness of breath.   Gastrointestinal: Positive for nausea and vomiting.   Objective:  BP (!) 143/78   Pulse (!) 105   Temp 97.5 F (36.4 C) (Oral)   Resp 14   Wt 134 lb (60.8 kg)   SpO2 97%   BMI 22.65 kg/m   BP/Weight 12/01/2016 10/28/2016 06/09/2016  Systolic BP 143 122 124  Diastolic BP 78 76 86  Wt. (Lbs) 134 134 135.4  BMI 22.65 22.65 22.89   Physical Exam  Constitutional: Shelby Boyd is oriented to person, place, and time. Shelby Boyd appears well-developed. No distress.  HENT:  Mouth/Throat:  Oropharynx is clear and moist.  Normal TM's bilaterally.  Cardiovascular: Regular rhythm.  Tachycardia present.   Pulmonary/Chest: Effort normal and breath sounds normal.  Neurological: Shelby Boyd is alert and oriented to person, place, and time.  Psychiatric: Shelby Boyd has a normal mood and affect.  Vitals reviewed.  Assessment & Plan:   Problem List Items Addressed This Visit    URI (upper respiratory infection)    New acute illness (uncomplicated). Advised OTC Analgesics and antihistamine.         Follow-up: PRN  Everlene OtherJayce Joanathan Affeldt DO [facility] [place]

## 2016-12-01 NOTE — Assessment & Plan Note (Signed)
New acute illness (uncomplicated). Advised OTC Analgesics and antihistamine.

## 2016-12-01 NOTE — Progress Notes (Signed)
Pre visit review using our clinic review tool, if applicable. No additional management support is needed unless otherwise documented below in the visit note. 

## 2016-12-03 ENCOUNTER — Telehealth: Payer: Self-pay

## 2016-12-03 NOTE — Telephone Encounter (Signed)
Patient was unable to do phone triage asked me to call back tomorrow. [facility]

## 2017-01-26 ENCOUNTER — Ambulatory Visit: Payer: Medicare Other | Admitting: Family Medicine

## 2017-02-16 ENCOUNTER — Other Ambulatory Visit: Payer: Self-pay | Admitting: Family Medicine

## 2017-02-16 MED ORDER — AMPHETAMINE-DEXTROAMPHETAMINE 5 MG PO TABS: 5.0000 mg | ORAL_TABLET | Freq: Two times a day (BID) | ORAL | 0 refills | Status: DC

## 2017-02-16 NOTE — Telephone Encounter (Signed)
Pt called about needing Rx for medication of amphetamine-dextroamphetamine (ADDERALL) 5 MG tablet. 60 tab per month one in the am and one in the afternoon.   Call pt when it's ready for pick up @ [phone]. Thank you!

## 2017-02-16 NOTE — Telephone Encounter (Signed)
Last OV with you 10/28/16 last filled 11/02/16 60 0rf

## 2017-02-16 NOTE — Telephone Encounter (Signed)
Patient notified rx is ready to be picked up

## 2017-03-01 ENCOUNTER — Encounter: Payer: Self-pay | Admitting: Family Medicine

## 2017-03-01 ENCOUNTER — Ambulatory Visit (INDEPENDENT_AMBULATORY_CARE_PROVIDER_SITE_OTHER): Payer: Medicare Other | Admitting: Family Medicine

## 2017-03-01 VITALS — BP 132/86 | HR 97 | Temp 97.8°F | Wt 135.4 lb

## 2017-03-01 DIAGNOSIS — R635 Abnormal weight gain: Secondary | ICD-10-CM

## 2017-03-01 DIAGNOSIS — Z13220 Encounter for screening for lipoid disorders: Secondary | ICD-10-CM

## 2017-03-01 DIAGNOSIS — F988 Other specified behavioral and emotional disorders with onset usually occurring in childhood and adolescence: Secondary | ICD-10-CM

## 2017-03-01 DIAGNOSIS — G629 Polyneuropathy, unspecified: Secondary | ICD-10-CM | POA: Diagnosis not present

## 2017-03-01 LAB — COMPREHENSIVE METABOLIC PANEL
ALBUMIN: 4.4 g/dL (ref 3.5–5.2)
ALT: 15 U/L (ref 0–35)
AST: 14 U/L (ref 0–37)
Alkaline Phosphatase: 95 U/L (ref 39–117)
BUN: 17 mg/dL (ref 6–23)
CO2: 29 mEq/L (ref 19–32)
CREATININE: 0.8 mg/dL (ref 0.40–1.20)
Calcium: 9.7 mg/dL (ref 8.4–10.5)
Chloride: 105 mEq/L (ref 96–112)
GFR: 75.52 mL/min (ref >60.00)
GLUCOSE: 88 mg/dL (ref 70–99)
POTASSIUM: 4.8 meq/L (ref 3.5–5.1)
SODIUM: 140 meq/L (ref 135–145)
Total Bilirubin: 0.5 mg/dL (ref 0.2–1.2)
Total Protein: 7.1 g/dL (ref 6.0–8.3)

## 2017-03-01 LAB — LIPID PANEL
CHOLESTEROL: 187 mg/dL (ref 0–200)
HDL: 42.5 mg/dL (ref >39.00)
LDL CALC: 124 mg/dL — AB (ref 0–99)
NONHDL: 144.32
Total CHOL/HDL Ratio: 4
Triglycerides: 102 mg/dL (ref 0.0–149.0)
VLDL: 20.4 mg/dL (ref 0.0–40.0)

## 2017-03-01 LAB — VITAMIN B12: VITAMIN B 12: 386 pg/mL (ref 211–911)

## 2017-03-01 LAB — HEMOGLOBIN A1C: HEMOGLOBIN A1C: 5.6 % (ref 4.6–6.5)

## 2017-03-01 MED ORDER — AMPHETAMINE-DEXTROAMPHETAMINE 5 MG PO TABS: 5.0000 mg | ORAL_TABLET | Freq: Two times a day (BID) | ORAL | 0 refills | Status: DC

## 2017-03-01 NOTE — Progress Notes (Signed)
Pre visit review using our clinic review tool, if applicable. No additional management support is needed unless otherwise documented below in the visit note. 

## 2017-03-01 NOTE — Assessment & Plan Note (Signed)
Stable. Tolerating Adderall. Refills given.

## 2017-03-01 NOTE — Assessment & Plan Note (Signed)
Patient appears to have what sounds like neuropathy. Mild decrease monofilament testing in bilateral great toes. Otherwise feet with intact sensation. We will check B12, A1c, and basic lab work. Discussed the option of seeing a neurologist after the lab work returns if there is no cause and she deferred this and opted to continue to monitor.

## 2017-03-01 NOTE — Patient Instructions (Addendum)
Nice to see you. We will check lab work and contact you with the results. Please contact [facility] for an appointment. Dr Rhea Belton or Dr Adela Lank or Dr Leone Payor are good physicians to try to get in to see. [phone].

## 2017-03-01 NOTE — Assessment & Plan Note (Signed)
Weight has been stable in the normal range. Discussed continuing diet and exercise.

## 2017-03-01 NOTE — Progress Notes (Signed)
Tommi Rumps, MD Phone: [phone]  Shelby Boyd is a 71 y.o. female who presents today for follow-up.  ADD: Patient notes Adderall has been beneficial. Occasionally she forgets her afternoon dose and she does notice a difference. No palpitations. Weight is stable. No appetite suppression.  Patient notes in the mornings when she gets up occasionally feels as though the bottom of her feet are on gel. Occasionally they seem red. There is no pain. Occasionally her toes feel numb with this. Note she takes about 8-10 steps and this resolves. No issues the rest of the day. Had evaluation through cardiology previously with what sound to be ABIs that possibly reveal venous insufficiency. She notes no history of neuropathy or diabetes.  Weight has been stable. She notes she is trying to stop eating when she feels full as opposed to continuing to eat. She is playing tennis 3-4 times a week and renovating a house.  PMH: Former smoker   ROS see history of present illness  Objective  Physical Exam Vitals:   03/01/17 1004  BP: 132/86  Pulse: 97  Temp: 97.8 F (36.6 C)    BP Readings from Last 3 Encounters:  03/01/17 132/86  12/01/16 (!) 143/78  10/28/16 122/76   Wt Readings from Last 3 Encounters:  03/01/17 135 lb 6.4 oz (61.4 kg)  12/01/16 134 lb (60.8 kg)  10/28/16 134 lb (60.8 kg)    Physical Exam  Constitutional: No distress.  Cardiovascular: Normal rate, regular rhythm and normal heart sounds.   Pulmonary/Chest: Effort normal and breath sounds normal.  Musculoskeletal: She exhibits no edema.  Neurological: She is alert. Gait normal.  Skin: She is not diaphoretic.  Bilateral feet with 2+ DP and PT pulses, sensation to light touch intact bilaterally, decreased monofilament testing on the plantar surface of the bilateral great toes though otherwise intact     Assessment/Plan: Please see individual problem list.  ADD (attention deficit disorder) Stable. Tolerating  Adderall. Refills given.  Weight gain Weight has been stable in the normal range. Discussed continuing diet and exercise.   Neuropathy ([place]) Patient appears to have what sounds like neuropathy. Mild decrease monofilament testing in bilateral great toes. Otherwise feet with intact sensation. We will check B12, A1c, and basic lab work. Discussed the option of seeing a neurologist after the lab work returns if there is no cause and she deferred this and opted to continue to monitor.   Orders Placed This Encounter  Procedures  . Comp Met (CMET)  . Lipid Profile  . B12  . HgB A1c    Meds ordered this encounter  Medications  . amphetamine-dextroamphetamine (ADDERALL) 5 MG tablet    Sig: Take 1 tablet (5 mg total) by mouth 2 (two) times daily with a meal. Do not fill until 05/01/17    Dispense:  60 tablet    Refill:  0  . amphetamine-dextroamphetamine (ADDERALL) 5 MG tablet    Sig: Take 1 tablet (5 mg total) by mouth 2 (two) times daily with a meal. Do not fill until 03/31/17    Dispense:  60 tablet    Refill:  0  . amphetamine-dextroamphetamine (ADDERALL) 5 MG tablet    Sig: Take 1 tablet (5 mg total) by mouth 2 (two) times daily with a meal.    Dispense:  60 tablet    Refill:  0    # Healthcare maintenance: Patient is due for colonoscopy. This was attempted to be set up previously though she did not like how the  referral was handled and never followed up on it as a result. She requests the name and number of a GI physician to have this done. She is provided with the number for [facility] and 3 names of GI physicians that practice for [place].   Tommi Rumps, MD [place]

## 2017-03-31 IMAGING — DX DG FOREARM 2V*R*
2 series · 2 of 2 positions shown · non-contrast
Comparison: None.

CLINICAL DATA: Ivanna Gad in right posterior distal form.

EXAM:
RIGHT FOREARM - 2 VIEW

[forearm ap]
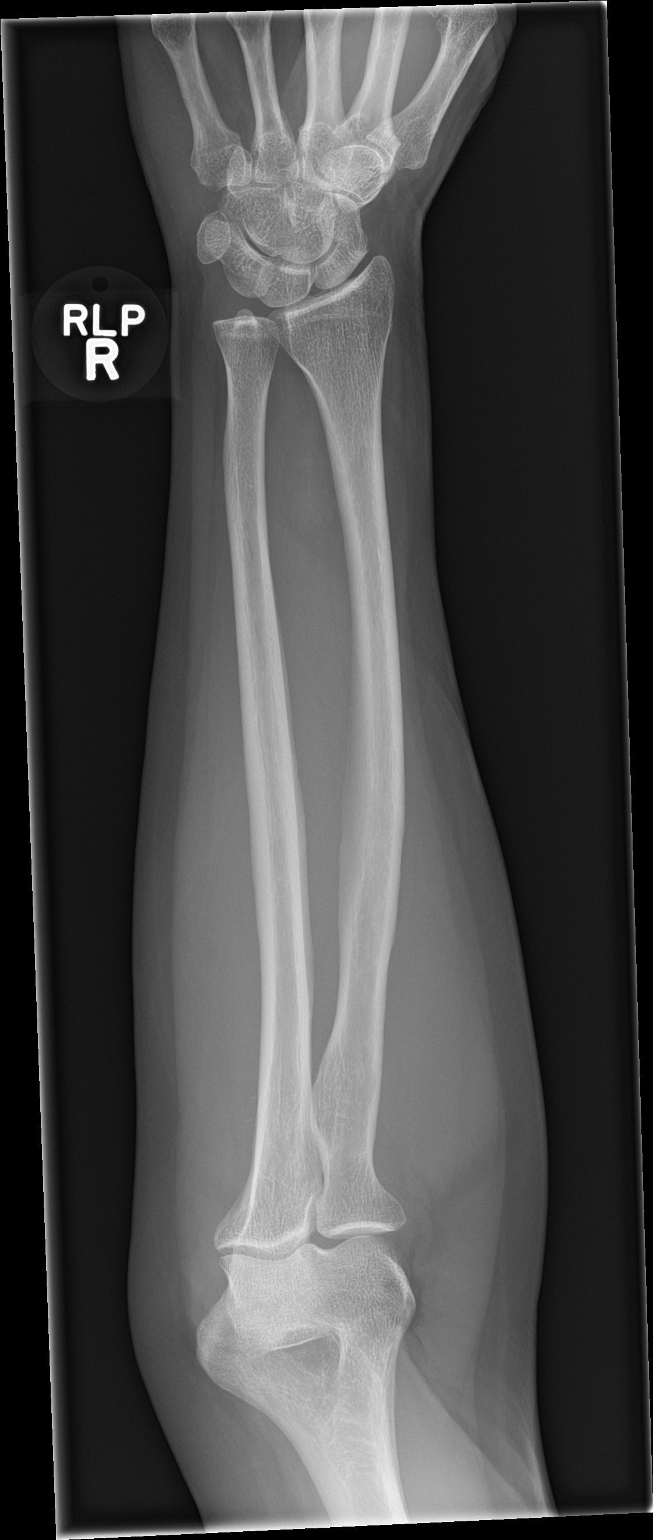

[forearm lat]
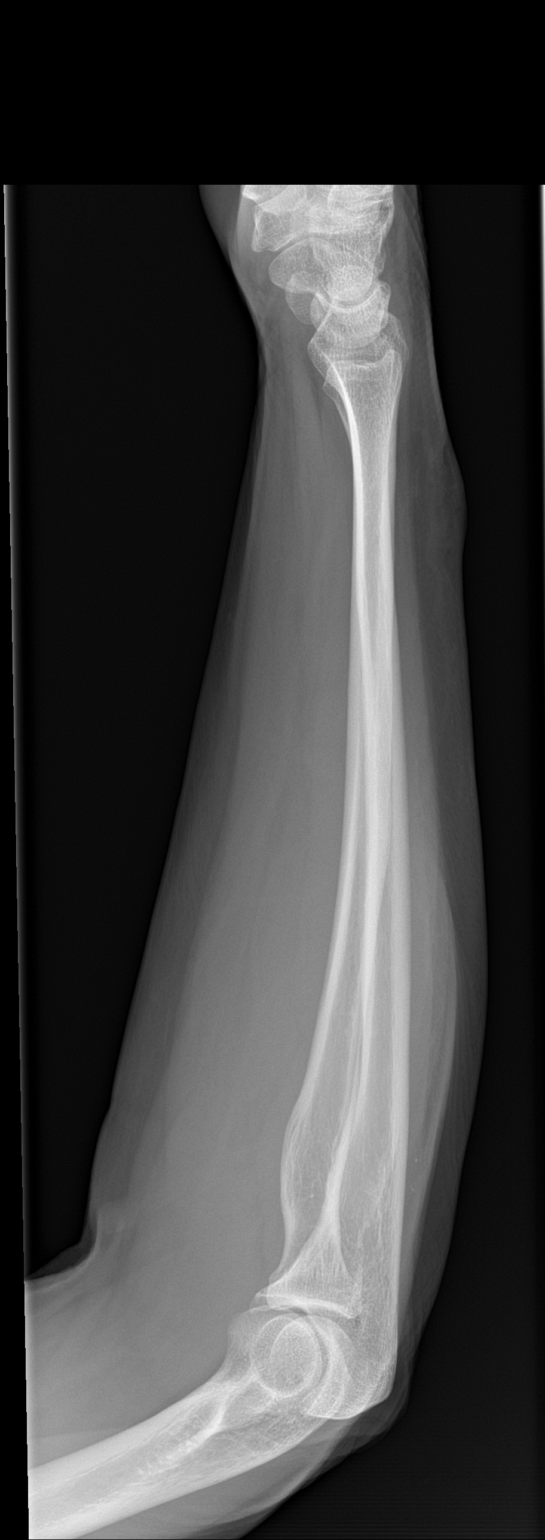

[2 of 2 positions shown; findings below may reference images not displayed]

FINDINGS: No radiodense foreign body identified within the soft tissues of the
right forearm. There is soft tissue irregularity posterior to the
distal radius/ulna but no discrete foreign body identified.
Underlying osseous structures of the right forearm appear normal.
IMPRESSION: Focal soft tissue irregularity, presumably laceration and/or edema,
posterior to the distal right forearm. No radiodense foreign body
appreciated. Osseous structures appear normal.

## 2017-06-30 ENCOUNTER — Telehealth: Payer: Self-pay | Admitting: Family Medicine

## 2017-06-30 DIAGNOSIS — H524 Presbyopia: Secondary | ICD-10-CM | POA: Diagnosis not present

## 2017-06-30 DIAGNOSIS — H52223 Regular astigmatism, bilateral: Secondary | ICD-10-CM | POA: Diagnosis not present

## 2017-06-30 DIAGNOSIS — H5203 Hypermetropia, bilateral: Secondary | ICD-10-CM | POA: Diagnosis not present

## 2017-06-30 MED ORDER — AMPHETAMINE-DEXTROAMPHETAMINE 5 MG PO TABS: 5.0000 mg | ORAL_TABLET | Freq: Two times a day (BID) | ORAL | 0 refills | Status: DC

## 2017-06-30 NOTE — Telephone Encounter (Signed)
Printed. Please place at front for pickup. Thanks.

## 2017-06-30 NOTE — Telephone Encounter (Signed)
Please advise 

## 2017-06-30 NOTE — Telephone Encounter (Signed)
Pt called wanting to know if she can get a Rx to pick up before appt pt will run out of medication. amphetamine-dextroamphetamine (ADDERALL) 5 MG tablet.  Call pt @ [phone]. Thank you!

## 2017-06-30 NOTE — Telephone Encounter (Signed)
Patient notified and placed at front desk 

## 2017-07-07 ENCOUNTER — Ambulatory Visit (INDEPENDENT_AMBULATORY_CARE_PROVIDER_SITE_OTHER): Payer: Medicare Other | Admitting: Family Medicine

## 2017-07-07 ENCOUNTER — Encounter: Payer: Self-pay | Admitting: Family Medicine

## 2017-07-07 DIAGNOSIS — F988 Other specified behavioral and emotional disorders with onset usually occurring in childhood and adolescence: Secondary | ICD-10-CM | POA: Diagnosis not present

## 2017-07-07 DIAGNOSIS — G629 Polyneuropathy, unspecified: Secondary | ICD-10-CM | POA: Diagnosis not present

## 2017-07-08 MED ORDER — AMPHETAMINE-DEXTROAMPHETAMINE 10 MG PO TABS: 5.0000 mg | ORAL_TABLET | Freq: Two times a day (BID) | ORAL | 0 refills | Status: DC

## 2017-07-08 NOTE — Assessment & Plan Note (Signed)
Much improved. She'll monitor for recurrence.

## 2017-07-08 NOTE — Assessment & Plan Note (Signed)
Tolerating medication. Refills given. Follow-up in 3 months.

## 2017-07-08 NOTE — Progress Notes (Signed)
  Marikay AlarEric Pike Scantlebury, MD Phone: [phone]  Shelby OctaveMichele Boyd is a 71 y.o. female who presents today for follow-up.  ADD: Taking Adderall daily. No weight changes. No appetite changes. No palpitations. No sleep changes.  She reports her feet are better. She does not have any of the same symptoms she had previously. She thinks it may have been related to cold whether.  ROS see history of present illness  Objective  Physical Exam Vitals:   07/07/17 1430  BP: 118/86  Pulse: 100  Resp: 16  Temp: 98.3 F (36.8 C)    BP Readings from Last 3 Encounters:  07/07/17 118/86  03/01/17 132/86  12/01/16 (!) 143/78   Wt Readings from Last 3 Encounters:  07/07/17 134 lb 4 oz (60.9 kg)  03/01/17 135 lb 6.4 oz (61.4 kg)  12/01/16 134 lb (60.8 kg)    Physical Exam  Constitutional: No distress.  Cardiovascular: Normal rate, regular rhythm and normal heart sounds.   Pulmonary/Chest: Effort normal and breath sounds normal.  Neurological: She is alert. Gait normal.  Sensation light touch intact bilateral feet  Skin: She is not diaphoretic.     Assessment/Plan: Please see individual problem list.  ADD (attention deficit disorder) Tolerating medication. Refills given. Follow-up in 3 months.  Neuropathy Much improved. She'll monitor for recurrence.   No orders of the defined types were placed in this encounter.   Meds ordered this encounter  Medications  . amphetamine-dextroamphetamine (ADDERALL) 10 MG tablet    Sig: Take 0.5 tablets (5 mg total) by mouth 2 (two) times daily.    Dispense:  30 tablet    Refill:  0  . amphetamine-dextroamphetamine (ADDERALL) 10 MG tablet    Sig: Take 0.5 tablets (5 mg total) by mouth 2 (two) times daily. Do not fill until 08/08/17    Dispense:  30 tablet    Refill:  0  . amphetamine-dextroamphetamine (ADDERALL) 10 MG tablet    Sig: Take 0.5 tablets (5 mg total) by mouth 2 (two) times daily. Do not fill until 09/07/17    Dispense:  30 tablet   Refill:  0   Marikay AlarEric Gurkirat Basher, MD [facility]

## 2017-07-16 ENCOUNTER — Telehealth: Payer: Self-pay | Admitting: Family Medicine

## 2017-07-16 NOTE — Telephone Encounter (Signed)
Left pt message asking to call Allison back directly at [phone] to schedule AWV. Thanks! ° °*NOTE* Never had AWV before °

## 2017-08-20 NOTE — Telephone Encounter (Signed)
Pt declined AWV. Do not call and ask again. Only wants to see Dr. Birdie Sons

## 2017-10-18 ENCOUNTER — Telehealth: Payer: Self-pay | Admitting: Family Medicine

## 2017-10-18 ENCOUNTER — Ambulatory Visit: Payer: Medicare Other | Admitting: Family Medicine

## 2017-10-18 ENCOUNTER — Other Ambulatory Visit: Payer: Self-pay

## 2017-10-18 NOTE — Telephone Encounter (Signed)
Patient would like a refill on adderall, she is scheduled for follow up on 11/03/17 but will be out on December 1st  Last filled 07/08/17 30 2  rf last OV 07/07/17

## 2017-10-18 NOTE — Telephone Encounter (Signed)
Please advise 

## 2017-10-18 NOTE — Telephone Encounter (Signed)
Copied from CRM #[id number]. Topic: Quick Communication - Office Called Patient >> Oct 18, 2017  8:39 AM Stephannie LiSimmons, Tyrell Brereton L, NT wrote: Reason for CRM: Patient [place] a call from  [place]

## 2017-10-26 MED ORDER — AMPHETAMINE-DEXTROAMPHETAMINE 10 MG PO TABS: 5.0000 mg | ORAL_TABLET | Freq: Two times a day (BID) | ORAL | 0 refills | Status: DC

## 2017-10-27 NOTE — Telephone Encounter (Signed)
Patient notified rx is ready for pick up

## 2017-11-03 ENCOUNTER — Other Ambulatory Visit: Payer: Self-pay

## 2017-11-03 ENCOUNTER — Encounter: Payer: Self-pay | Admitting: Family Medicine

## 2017-11-03 ENCOUNTER — Ambulatory Visit (INDEPENDENT_AMBULATORY_CARE_PROVIDER_SITE_OTHER): Payer: Medicare Other | Admitting: Family Medicine

## 2017-11-03 DIAGNOSIS — F988 Other specified behavioral and emotional disorders with onset usually occurring in childhood and adolescence: Secondary | ICD-10-CM

## 2017-11-03 DIAGNOSIS — Z1211 Encounter for screening for malignant neoplasm of colon: Secondary | ICD-10-CM | POA: Diagnosis not present

## 2017-11-03 MED ORDER — AMPHETAMINE-DEXTROAMPHETAMINE 10 MG PO TABS: 5.0000 mg | ORAL_TABLET | Freq: Two times a day (BID) | ORAL | 0 refills | Status: AC

## 2017-11-03 NOTE — Assessment & Plan Note (Signed)
Well-controlled.  Continue current regimen. 

## 2017-11-03 NOTE — Progress Notes (Signed)
  Shelby AlarEric Maximilliano Kersh, MD Phone: [phone]  Shelby Boyd is a 71 y.o. female who presents today for follow-up.  ADD: Taking Adderall with good benefit.  No palpitations.  Weight has been stable.  No appetite suppression.  [facility] [place]  This is for colon cancer screening. Reports blood pressure is typically normal when she checks it at home.  She did have [place] food last night.  Social History   Tobacco Use  Smoking Status Former Smoker  Smokeless Tobacco Never Used     ROS see history of present illness  Objective  Physical Exam Vitals:   11/03/17 0941 11/03/17 1002  BP: 140/90 128/90  Pulse: 97   Temp: 97.7 F (36.5 C)   SpO2: 96%     BP Readings from Last 3 Encounters:  11/03/17 128/90  07/07/17 118/86  03/01/17 132/86   Wt Readings from Last 3 Encounters:  11/03/17 135 lb 3.2 oz (61.3 kg)  07/07/17 134 lb 4 oz (60.9 kg)  03/01/17 135 lb 6.4 oz (61.4 kg)    Physical Exam  Constitutional: No distress.  Cardiovascular: Normal rate, regular rhythm and normal heart sounds.  Pulmonary/Chest: Effort normal and breath sounds normal.  Neurological: She is alert. Gait normal.  Skin: She is not diaphoretic.     Assessment/Plan: Please see individual problem list.  ADD (attention deficit disorder) Well-controlled.  Continue current regimen.  Colon cancer screening She will proceed with colonoscopy as planned.  Monitor BP at home. Limit salt intake  Shelby Boyd was seen today for follow-up.  Diagnoses and all orders for this visit:  Attention deficit disorder (ADD) without hyperactivity  Colon cancer screening  Other orders -     amphetamine-dextroamphetamine (ADDERALL) 10 MG tablet; Take 0.5 tablets (5 mg total) by mouth 2 (two) times daily. Do not fill until 01/04/18 -     amphetamine-dextroamphetamine (ADDERALL) 10 MG tablet; Take 0.5 tablets (5 mg total) by mouth 2 (two) times daily. Do not fill  until 12/04/17 -     amphetamine-dextroamphetamine (ADDERALL) 10 MG tablet; Take 0.5 tablets (5 mg total) by mouth 2 (two) times daily.    No orders of the defined types were placed in this encounter.   Meds ordered this encounter  Medications  . amphetamine-dextroamphetamine (ADDERALL) 10 MG tablet    Sig: Take 0.5 tablets (5 mg total) by mouth 2 (two) times daily. Do not fill until 01/04/18    Dispense:  30 tablet    Refill:  0  . amphetamine-dextroamphetamine (ADDERALL) 10 MG tablet    Sig: Take 0.5 tablets (5 mg total) by mouth 2 (two) times daily. Do not fill until 12/04/17    Dispense:  30 tablet    Refill:  0  . amphetamine-dextroamphetamine (ADDERALL) 10 MG tablet    Sig: Take 0.5 tablets (5 mg total) by mouth 2 (two) times daily.    Dispense:  30 tablet    Refill:  0     Shelby AlarEric Shelby [place], MD [facility]

## 2017-11-03 NOTE — Patient Instructions (Addendum)
Nice to see you. Please keep an eye on your blood pressure. We will refill your Adderall.

## 2017-11-03 NOTE — Assessment & Plan Note (Signed)
She will proceed with colonoscopy as planned.

## 2018-01-26 DIAGNOSIS — Z0100 Encounter for examination of eyes and vision without abnormal findings: Secondary | ICD-10-CM | POA: Diagnosis not present

## 2018-02-15 DIAGNOSIS — R7309 Other abnormal glucose: Secondary | ICD-10-CM | POA: Diagnosis not present

## 2018-02-15 DIAGNOSIS — G629 Polyneuropathy, unspecified: Secondary | ICD-10-CM | POA: Diagnosis not present

## 2018-02-15 DIAGNOSIS — E7439 Other disorders of intestinal carbohydrate absorption: Secondary | ICD-10-CM | POA: Diagnosis not present

## 2018-02-15 DIAGNOSIS — R69 Illness, unspecified: Secondary | ICD-10-CM | POA: Diagnosis not present

## 2018-02-15 DIAGNOSIS — E7800 Pure hypercholesterolemia, unspecified: Secondary | ICD-10-CM | POA: Diagnosis not present

## 2018-02-15 DIAGNOSIS — I10 Essential (primary) hypertension: Secondary | ICD-10-CM | POA: Diagnosis not present

## 2018-02-22 DIAGNOSIS — E7439 Other disorders of intestinal carbohydrate absorption: Secondary | ICD-10-CM | POA: Diagnosis not present

## 2018-02-22 DIAGNOSIS — I10 Essential (primary) hypertension: Secondary | ICD-10-CM | POA: Diagnosis not present

## 2018-02-22 DIAGNOSIS — M2550 Pain in unspecified joint: Secondary | ICD-10-CM | POA: Diagnosis not present

## 2018-02-22 DIAGNOSIS — E7800 Pure hypercholesterolemia, unspecified: Secondary | ICD-10-CM | POA: Diagnosis not present

## 2018-02-22 DIAGNOSIS — R7309 Other abnormal glucose: Secondary | ICD-10-CM | POA: Diagnosis not present

## 2018-02-22 DIAGNOSIS — R69 Illness, unspecified: Secondary | ICD-10-CM | POA: Diagnosis not present

## 2018-03-15 DIAGNOSIS — I10 Essential (primary) hypertension: Secondary | ICD-10-CM | POA: Diagnosis not present

## 2018-03-15 DIAGNOSIS — R69 Illness, unspecified: Secondary | ICD-10-CM | POA: Diagnosis not present

## 2018-08-25 DIAGNOSIS — H5200 Hypermetropia, unspecified eye: Secondary | ICD-10-CM | POA: Diagnosis not present

## 2018-09-14 DIAGNOSIS — R69 Illness, unspecified: Secondary | ICD-10-CM | POA: Diagnosis not present

## 2019-06-19 ENCOUNTER — Encounter: Payer: Self-pay | Admitting: Emergency Medicine

## 2019-06-19 ENCOUNTER — Other Ambulatory Visit: Payer: Self-pay

## 2019-06-19 ENCOUNTER — Emergency Department
Admission: EM | Admit: 2019-06-19 | Discharge: 2019-06-19 | Discharge status: Against Medical Advice | Payer: Medicare HMO | Attending: Emergency Medicine | Admitting: Emergency Medicine

## 2019-06-19 DIAGNOSIS — Z5321 Procedure and treatment not carried out due to patient leaving prior to being seen by health care provider: Secondary | ICD-10-CM | POA: Diagnosis not present

## 2019-06-19 DIAGNOSIS — R112 Nausea with vomiting, unspecified: Secondary | ICD-10-CM | POA: Insufficient documentation

## 2019-06-19 DIAGNOSIS — R1010 Upper abdominal pain, unspecified: Secondary | ICD-10-CM | POA: Diagnosis present

## 2019-06-19 LAB — URINALYSIS, COMPLETE (UACMP) WITH MICROSCOPIC
Bacteria, UA: NONE SEEN
Bilirubin Urine: NEGATIVE
Glucose, UA: NEGATIVE mg/dL
Ketones, ur: 20 mg/dL — AB
Leukocytes,Ua: NEGATIVE
Nitrite: NEGATIVE
Protein, ur: NEGATIVE mg/dL
Specific Gravity, Urine: 1.012 (ref 1.005–1.030)
pH: 7 (ref 5.0–8.0)

## 2019-06-19 LAB — COMPREHENSIVE METABOLIC PANEL
ALT: 27 U/L (ref 0–44)
AST: 24 U/L (ref 15–41)
Albumin: 4.6 g/dL (ref 3.5–5.0)
Alkaline Phosphatase: 106 U/L (ref 38–126)
Anion gap: 11 (ref 5–15)
BUN: 14 mg/dL (ref 8–23)
CO2: 26 mmol/L (ref 22–32)
Calcium: 9.5 mg/dL (ref 8.9–10.3)
Chloride: 102 mmol/L (ref 98–111)
Creatinine, Ser: 0.82 mg/dL (ref 0.44–1.00)
GFR calc Af Amer: >60 mL/min (ref >60)
GFR calc non Af Amer: >60 mL/min (ref >60)
Glucose, Bld: 116 mg/dL — ABNORMAL HIGH (ref 70–99)
Potassium: 3.9 mmol/L (ref 3.5–5.1)
Sodium: 139 mmol/L (ref 135–145)
Total Bilirubin: 0.7 mg/dL (ref 0.3–1.2)
Total Protein: 8.3 g/dL — ABNORMAL HIGH (ref 6.5–8.1)

## 2019-06-19 LAB — CBC WITH DIFFERENTIAL/PLATELET
Abs Immature Granulocytes: 0.06 10*3/uL (ref 0.00–0.07)
Basophils Absolute: 0.1 10*3/uL (ref 0.0–0.1)
Basophils Relative: 0 %
Eosinophils Absolute: 0.2 10*3/uL (ref 0.0–0.5)
Eosinophils Relative: 1 %
HCT: 42.4 % (ref 36.0–46.0)
Hemoglobin: 14.6 g/dL (ref 12.0–15.0)
Immature Granulocytes: 0 %
Lymphocytes Relative: 13 %
Lymphs Abs: 2.3 10*3/uL (ref 0.7–4.0)
MCH: 30 pg (ref 26.0–34.0)
MCHC: 34.4 g/dL (ref 30.0–36.0)
MCV: 87.1 fL (ref 80.0–100.0)
Monocytes Absolute: 0.9 10*3/uL (ref 0.1–1.0)
Monocytes Relative: 5 %
Neutro Abs: 13.7 10*3/uL — ABNORMAL HIGH (ref 1.7–7.7)
Neutrophils Relative %: 81 %
Platelets: 237 10*3/uL (ref 150–400)
RBC: 4.87 MIL/uL (ref 3.87–5.11)
RDW: 12.7 % (ref 11.5–15.5)
WBC: 17.2 10*3/uL — ABNORMAL HIGH (ref 4.0–10.5)
nRBC: 0 % (ref 0.0–0.2)

## 2019-06-19 LAB — LIPASE, BLOOD: Lipase: 24 U/L (ref 11–51)

## 2019-06-19 LAB — TROPONIN I (HIGH SENSITIVITY): Troponin I (High Sensitivity): 7 ng/L (ref <18)

## 2019-06-19 NOTE — ED Triage Notes (Signed)
Pt to triage via w/c with mask in place, no distress noted; pt reports upper abd pain accomp by N/V

## 2019-06-20 ENCOUNTER — Telehealth: Payer: Self-pay | Admitting: Emergency Medicine

## 2019-06-20 NOTE — Telephone Encounter (Signed)
Patient called me back.  Says she is no longer having nausea or upper abdominal pain, but has tenderness in an area on right lower abdomen.  I explained that her wbc was elevated.  She does have an appt with her doctor tomorrow.  I told her that I would leave it up to her, as we do have a wait to be seen here.  She agrees to return if her pain increases or if she starts vomiting again.  Otherwise she will see her physician tomorrow and make them aware of the labs done here.

## 2019-06-20 NOTE — Telephone Encounter (Signed)
Called patient due to lwot to inquire about condition and follow up plans. Left message.   

## 2019-12-22 ENCOUNTER — Ambulatory Visit: Payer: Medicare HMO | Attending: Internal Medicine

## 2019-12-22 DIAGNOSIS — Z23 Encounter for immunization: Secondary | ICD-10-CM | POA: Insufficient documentation

## 2019-12-22 NOTE — Progress Notes (Signed)
   Covid-19 Vaccination Clinic  Name:  Shelby Boyd    MRN: [id number] DOB: [birth year]  12/22/2019  Shelby Boyd was observed post Covid-19 immunization for 15 minutes without incidence. She was provided with Vaccine Information Sheet and instruction to access the V-Safe system.   Shelby Boyd was instructed to call 911 with any severe reactions post vaccine: Marland Kitchen Difficulty breathing  . Swelling of your face and throat  . A fast heartbeat  . A bad rash all over your body  . Dizziness and weakness    Immunizations Administered    Name Date Dose VIS Date Route   Pfizer COVID-19 Vaccine 12/22/2019 12:03 PM 0.3 mL 11/10/2019 Intramuscular   Manufacturer: [employer], [employer]   Lot: [id number]   NDC: [address]-6

## 2020-01-10 ENCOUNTER — Ambulatory Visit: Payer: Medicare HMO | Attending: Internal Medicine

## 2020-01-10 DIAGNOSIS — Z23 Encounter for immunization: Secondary | ICD-10-CM

## 2020-01-10 NOTE — Progress Notes (Signed)
   Covid-19 Vaccination Clinic  Name:  Shelby Boyd    MRN: [id number] DOB: 09/12/1946  01/10/2020  Shelby Boyd was observed post Covid-19 immunization for 15 minutes without incidence. She was provided with Vaccine Information Sheet and instruction to access the V-Safe system.   Shelby Boyd was instructed to call 911 with any severe reactions post vaccine: Marland Kitchen Difficulty breathing  . Swelling of your face and throat  . A fast heartbeat  . A bad rash all over your body  . Dizziness and weakness    Immunizations Administered    Name Date Dose VIS Date Route   Pfizer COVID-19 Vaccine 01/10/2020 11:14 AM 0.3 mL 11/10/2019 Intramuscular   Manufacturer: [employer], [employer]   Lot: [id number]   NDC: [address]-2
